# Patient Record
Sex: Male | Born: 2000
Health system: Southern US, Community
[De-identification: ages and names within clinical notes are randomized; demographics above are authoritative.]

## PROBLEM LIST (undated history)

## (undated) HISTORY — PX: TONSILLECTOMY: SUR1361

---
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
## 2019-08-25 ENCOUNTER — Other Ambulatory Visit: Payer: Self-pay

## 2019-08-25 ENCOUNTER — Encounter (HOSPITAL_BASED_OUTPATIENT_CLINIC_OR_DEPARTMENT_OTHER): Payer: Self-pay

## 2019-08-25 ENCOUNTER — Emergency Department (HOSPITAL_BASED_OUTPATIENT_CLINIC_OR_DEPARTMENT_OTHER)
Admission: EM | Admit: 2019-08-25 | Discharge: 2019-08-25 | Disposition: A | Discharge status: Home / Self Care | Payer: BC Managed Care – PPO | Attending: Emergency Medicine | Admitting: Emergency Medicine

## 2019-08-25 DIAGNOSIS — R350 Frequency of micturition: Secondary | ICD-10-CM | POA: Diagnosis not present

## 2019-08-25 DIAGNOSIS — Z720 Tobacco use: Secondary | ICD-10-CM | POA: Diagnosis not present

## 2019-08-25 DIAGNOSIS — Z79899 Other long term (current) drug therapy: Secondary | ICD-10-CM | POA: Insufficient documentation

## 2019-08-25 DIAGNOSIS — M545 Low back pain, unspecified: Secondary | ICD-10-CM

## 2019-08-25 DIAGNOSIS — R300 Dysuria: Secondary | ICD-10-CM | POA: Insufficient documentation

## 2019-08-25 LAB — COMPREHENSIVE METABOLIC PANEL
ALT: 15 U/L (ref 0–44)
AST: 18 U/L (ref 15–41)
Albumin: 4.6 g/dL (ref 3.5–5.0)
Alkaline Phosphatase: 45 U/L (ref 38–126)
Anion gap: 10 (ref 5–15)
BUN: 17 mg/dL (ref 6–20)
CO2: 24 mmol/L (ref 22–32)
Calcium: 9.5 mg/dL (ref 8.9–10.3)
Chloride: 105 mmol/L (ref 98–111)
Creatinine, Ser: 0.96 mg/dL (ref 0.61–1.24)
GFR calc Af Amer: >60 mL/min (ref >60)
GFR calc non Af Amer: >60 mL/min (ref >60)
Glucose, Bld: 101 mg/dL — ABNORMAL HIGH (ref 70–99)
Potassium: 3.6 mmol/L (ref 3.5–5.1)
Sodium: 139 mmol/L (ref 135–145)
Total Bilirubin: 0.7 mg/dL (ref 0.3–1.2)
Total Protein: 7.7 g/dL (ref 6.5–8.1)

## 2019-08-25 LAB — CBC WITH DIFFERENTIAL/PLATELET
Abs Immature Granulocytes: 0.01 10*3/uL (ref 0.00–0.07)
Basophils Absolute: 0 10*3/uL (ref 0.0–0.1)
Basophils Relative: 0 %
Eosinophils Absolute: 0.1 10*3/uL (ref 0.0–0.5)
Eosinophils Relative: 3 %
HCT: 47.7 % (ref 39.0–52.0)
Hemoglobin: 16.5 g/dL (ref 13.0–17.0)
Immature Granulocytes: 0 %
Lymphocytes Relative: 40 %
Lymphs Abs: 1.9 10*3/uL (ref 0.7–4.0)
MCH: 31.1 pg (ref 26.0–34.0)
MCHC: 34.6 g/dL (ref 30.0–36.0)
MCV: 90 fL (ref 80.0–100.0)
Monocytes Absolute: 0.6 10*3/uL (ref 0.1–1.0)
Monocytes Relative: 12 %
Neutro Abs: 2 10*3/uL (ref 1.7–7.7)
Neutrophils Relative %: 45 %
Platelets: 234 10*3/uL (ref 150–400)
RBC: 5.3 MIL/uL (ref 4.22–5.81)
RDW: 11.7 % (ref 11.5–15.5)
WBC: 4.6 10*3/uL (ref 4.0–10.5)
nRBC: 0 % (ref 0.0–0.2)

## 2019-08-25 LAB — URINALYSIS, ROUTINE W REFLEX MICROSCOPIC
Bilirubin Urine: NEGATIVE
Glucose, UA: NEGATIVE mg/dL
Hgb urine dipstick: NEGATIVE
Ketones, ur: NEGATIVE mg/dL
Leukocytes,Ua: NEGATIVE
Nitrite: NEGATIVE
Protein, ur: NEGATIVE mg/dL
Specific Gravity, Urine: 1.025 (ref 1.005–1.030)
pH: 6 (ref 5.0–8.0)

## 2019-08-25 LAB — HIV ANTIBODY (ROUTINE TESTING W REFLEX): HIV Screen 4th Generation wRfx: NONREACTIVE

## 2019-08-25 LAB — CK: Total CK: 249 U/L (ref 49–397)

## 2019-08-25 NOTE — ED Provider Notes (Signed)
Charles Carpenter   CSN: [id number] Arrival date & time: 08/25/19  0745     History Chief Complaint  Patient presents with  . Urinary Frequency    Charles Carpenter is a 19 y.o. male.  He is complaining of months worth of pain in his right lower back.  For the past 2 days he has had some burning stinging pain when he urinates.  He is sexually active.  Denies any discharge or lesions.  No fevers chills nausea vomiting diarrhea constipation.  He said about a year ago during football camp had similar kind of pain in his back and ultimately was diagnosed with rhabdo and had some element of renal failure.  He never got a clear answer why that happened.  He said he is here to get checked for his renal function and his potassium.  Also wants STD testing.  Admits to smoking but no hard drugs.  The history is provided by the patient.  Urinary Frequency This is a new problem. The current episode started 2 days ago. The problem occurs hourly. The problem has not changed since onset.Pertinent negatives include no chest pain, no abdominal pain, no headaches and no shortness of breath. Exacerbated by: urinating. Nothing relieves the symptoms. He has tried nothing for the symptoms. The treatment provided no relief.       History reviewed. No pertinent past medical history.  There are no problems to display for this patient.   Past Surgical History:  Procedure Laterality Date  . TONSILLECTOMY         History reviewed. No pertinent family history.  Social History   Tobacco Use  . Smoking status: Never Smoker  . Smokeless tobacco: Never Used  Substance Use Topics  . Alcohol use: Not Currently  . Drug use: Not Currently    Home Medications Prior to Admission medications   Medication Sig Start Date End Date Taking? Authorizing Provider  cetirizine (ZYRTEC) 5 MG chewable tablet Chew 5 mg by mouth daily. Patient uses this medication for allergies.     [provider]  ibuprofen (ADVIL,MOTRIN) 800 MG tablet Take 1 tablet (800 mg total) by mouth every 8 (eight) hours as needed for headache. 03/15/16   Molpus, John, MD  pantoprazole (PROTONIX) 20 MG tablet Take 1 tablet (20 mg total) by mouth daily. 12/07/16   Julianne Rice, MD  sucralfate (CARAFATE) 1 GM/10ML suspension Take 10 mLs (1 g total) by mouth 4 (four) times daily -  with meals and at bedtime. 12/07/16   Julianne Rice, MD    Allergies    Patient has no known allergies.  Review of Systems   Review of Systems  Constitutional: Negative for fever.  HENT: Negative for sore throat.   Eyes: Negative for visual disturbance.  Respiratory: Negative for shortness of breath.   Cardiovascular: Negative for chest pain.  Gastrointestinal: Negative for abdominal pain, nausea and vomiting.  Genitourinary: Positive for dysuria and frequency.  Musculoskeletal: Positive for back pain.  Skin: Negative for rash.  Neurological: Negative for headaches.    Physical Exam Updated Vital Signs BP 115/81 (BP Location: Right Arm)   Pulse 73   Temp 97.8 F (36.6 C) (Oral)   Resp 18   Ht 6\' 3"  (1.905 m)   Wt 82.9 kg   SpO2 100%   BMI 22.85 kg/m   Physical Exam Vitals and nursing Carpenter reviewed.  Constitutional:      Appearance: He is well-developed.  HENT:  Head: Normocephalic and atraumatic.  Eyes:     Conjunctiva/sclera: Conjunctivae normal.  Cardiovascular:     Rate and Rhythm: Normal rate and regular rhythm.     Heart sounds: No murmur.  Pulmonary:     Effort: Pulmonary effort is normal. No respiratory distress.     Breath sounds: Normal breath sounds.  Abdominal:     Palpations: Abdomen is soft.     Tenderness: There is no abdominal tenderness.  Musculoskeletal:        General: No deformity or signs of injury. Normal range of motion.     Cervical back: Neck supple.  Skin:    General: Skin is warm and dry.     Capillary Refill: Capillary refill takes less than 2  seconds.  Neurological:     General: No focal deficit present.     Mental Status: He is alert.     Sensory: No sensory deficit.     Motor: No weakness.     Gait: Gait normal.     ED Results / Procedures / Treatments   Labs (all labs ordered are listed, but only abnormal results are displayed) Labs Reviewed  COMPREHENSIVE METABOLIC PANEL - Abnormal; Notable for the following components:      Result Value   Glucose, Bld 101 (*)    All other components within normal limits  URINALYSIS, ROUTINE W REFLEX MICROSCOPIC  HIV ANTIBODY (ROUTINE TESTING W REFLEX)  CBC WITH DIFFERENTIAL/PLATELET  CK  RPR  GC/CHLAMYDIA PROBE AMP ([place]) NOT AT [facility]    EKG None  Radiology No results found.  Procedures Procedures (including critical care time)  Medications Ordered in ED Medications - No data to display  ED Course  I have reviewed the triage vital signs and the nursing notes.  Pertinent labs & imaging results that were available during my care of the patient were reviewed by me and considered in my medical decision making (see chart for details).  Clinical Course as of Aug 25 1711  Thu Aug 25, 2019  1528 19 year old male here with some right-sided back pain and some urinary frequency and dysuria.  STDs possibility along with urinary tract infection although less likely in males.  Also with a strange history of rhabdo and renal failure so we will check labs.  Patient agreeable to HIV and RPR also.   [MB]  [id number] Patient's work-up essentially unremarkable.  I reviewed all this with him and let him know that his STD testing is still pending and we will contact him if anything was positive.  He is comfortable with plan.   [MB]    Clinical Course User Index [MB] Terrilee Files, MD   MDM Rules/Calculators/A&P                       Final Clinical Impression(s) / ED Diagnoses Final diagnoses:  Urinary frequency  Acute right-sided low back pain without sciatica    Rx / DC  Orders ED Discharge Orders    None       Terrilee Files, MD 08/25/19 1714

## 2019-08-25 NOTE — ED Notes (Signed)
ED Provider at bedside.  Pt provided urine specimen container, states unable to provide at this time.

## 2019-08-25 NOTE — ED Triage Notes (Signed)
Pt states pain with urination for past 2 days, had dx Rhabdo 1 year ago.  States urine clear, yellow, does report right sided pain.

## 2019-08-25 NOTE — Discharge Instructions (Addendum)
You were seen in the emergency department for evaluation of some right-sided low back pain along with some urinary frequency.  You had blood work including potassium, CK and renal function along with a urinalysis that did not show any serious findings.  You also requested STD testing and this is pending at time of discharge.  We will call you with any further treatment is needed.  Please contact your primary care doctor for close follow-up.  Return to the emergency department if any worsening or concerning symptoms.

## 2019-08-26 LAB — RPR: RPR Ser Ql: NONREACTIVE

## 2019-11-04 ENCOUNTER — Encounter (HOSPITAL_BASED_OUTPATIENT_CLINIC_OR_DEPARTMENT_OTHER): Payer: Self-pay

## 2019-11-04 ENCOUNTER — Emergency Department (HOSPITAL_BASED_OUTPATIENT_CLINIC_OR_DEPARTMENT_OTHER)
Admission: EM | Admit: 2019-11-04 | Discharge: 2019-11-04 | Disposition: A | Discharge status: Home / Self Care | Payer: No Typology Code available for payment source | Attending: Emergency Medicine | Admitting: Emergency Medicine

## 2019-11-04 ENCOUNTER — Emergency Department (HOSPITAL_BASED_OUTPATIENT_CLINIC_OR_DEPARTMENT_OTHER): Payer: No Typology Code available for payment source

## 2019-11-04 ENCOUNTER — Other Ambulatory Visit: Payer: Self-pay

## 2019-11-04 DIAGNOSIS — S0990XA Unspecified injury of head, initial encounter: Secondary | ICD-10-CM | POA: Insufficient documentation

## 2019-11-04 DIAGNOSIS — Y92410 Unspecified street and highway as the place of occurrence of the external cause: Secondary | ICD-10-CM | POA: Diagnosis not present

## 2019-11-04 DIAGNOSIS — Y9389 Activity, other specified: Secondary | ICD-10-CM | POA: Insufficient documentation

## 2019-11-04 DIAGNOSIS — M25512 Pain in left shoulder: Secondary | ICD-10-CM | POA: Diagnosis not present

## 2019-11-04 DIAGNOSIS — M542 Cervicalgia: Secondary | ICD-10-CM

## 2019-11-04 DIAGNOSIS — Y999 Unspecified external cause status: Secondary | ICD-10-CM | POA: Insufficient documentation

## 2019-11-04 DIAGNOSIS — Z79899 Other long term (current) drug therapy: Secondary | ICD-10-CM | POA: Diagnosis not present

## 2019-11-04 MED ORDER — METHOCARBAMOL 500 MG PO TABS: 500.0000 mg | ORAL_TABLET | Freq: Two times a day (BID) | ORAL | 0 refills | Status: AC

## 2019-11-04 MED ORDER — NAPROXEN 500 MG PO TABS: 500.0000 mg | ORAL_TABLET | Freq: Two times a day (BID) | ORAL | 0 refills | Status: AC

## 2019-11-04 MED ORDER — ACETAMINOPHEN 325 MG PO TABS
650.0000 mg | ORAL_TABLET | Freq: Once | ORAL | Status: DC
  Filled 2019-11-04: qty 2

## 2019-11-04 MED FILL — NAPROXEN 500 MG TABS: 500 | 15 days supply | Qty: 30 | Fill #0

## 2019-11-04 MED FILL — METHOCARBAMOL 500 MG TABS: 500 | 10 days supply | Qty: 20 | Fill #0
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
## 2019-11-04 NOTE — ED Notes (Signed)
Pt declines any pain relievers at this time,  Awaiting radiology studies.
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
## 2020-01-05 ENCOUNTER — Encounter (HOSPITAL_COMMUNITY): Payer: Self-pay

## 2020-01-05 ENCOUNTER — Other Ambulatory Visit: Payer: Self-pay

## 2020-01-05 ENCOUNTER — Emergency Department (HOSPITAL_COMMUNITY)
Admission: EM | Admit: 2020-01-05 | Discharge: 2020-01-05 | Disposition: A | Discharge status: Home / Self Care | Payer: Self-pay | Attending: Emergency Medicine | Admitting: Emergency Medicine

## 2020-01-05 DIAGNOSIS — Z5321 Procedure and treatment not carried out due to patient leaving prior to being seen by health care provider: Secondary | ICD-10-CM | POA: Insufficient documentation

## 2020-01-05 DIAGNOSIS — H538 Other visual disturbances: Secondary | ICD-10-CM | POA: Insufficient documentation

## 2020-01-05 DIAGNOSIS — R4781 Slurred speech: Secondary | ICD-10-CM | POA: Insufficient documentation

## 2020-01-05 NOTE — ED Triage Notes (Signed)
Pt presents to ED via EMS with headache x 1 hour with blurred vision, photosensitivity, and stuttering speech yesterday. VSS, NAD, A&Ox4

## 2020-01-05 NOTE — ED Notes (Signed)
Called for Pt for Vitals, CT and Registration. No answer.

## 2020-01-05 NOTE — ED Notes (Signed)
Unable to locate patient for VS and registration. Called multiple times.

## 2020-01-21 ENCOUNTER — Emergency Department (HOSPITAL_BASED_OUTPATIENT_CLINIC_OR_DEPARTMENT_OTHER)
Admission: EM | Admit: 2020-01-21 | Discharge: 2020-01-21 | Disposition: A | Discharge status: Home / Self Care | Payer: Medicaid Other | Attending: Emergency Medicine | Admitting: Emergency Medicine

## 2020-01-21 ENCOUNTER — Other Ambulatory Visit: Payer: Self-pay

## 2020-01-21 ENCOUNTER — Encounter (HOSPITAL_BASED_OUTPATIENT_CLINIC_OR_DEPARTMENT_OTHER): Payer: Self-pay | Admitting: Emergency Medicine

## 2020-01-21 DIAGNOSIS — Z79899 Other long term (current) drug therapy: Secondary | ICD-10-CM | POA: Diagnosis not present

## 2020-01-21 DIAGNOSIS — F1210 Cannabis abuse, uncomplicated: Secondary | ICD-10-CM | POA: Insufficient documentation

## 2020-01-21 DIAGNOSIS — R197 Diarrhea, unspecified: Secondary | ICD-10-CM | POA: Diagnosis not present

## 2020-01-21 DIAGNOSIS — R112 Nausea with vomiting, unspecified: Secondary | ICD-10-CM | POA: Insufficient documentation

## 2020-01-21 NOTE — ED Provider Notes (Signed)
[facility] EMERGENCY DEPARTMENT Provider Note   CSN: [id number] Arrival date & time: 01/21/20  1100     History Chief Complaint  Patient presents with  . Emesis    Charles Carpenter is a 19 y.o. male without significant past medical history, presenting to the emergency department with complaint of nausea, vomiting, and diarrhea that began last night after dinner.  He states he ate salmon, mashed potatoes, green beans and garlic bread.  Soon after he began having nonbloody nonbilious emesis and diarrhea.  He states his vomiting stopped around 4 AM this morning.  He still having some watery diarrhea.  He has some crampy abdominal pain prior to having a bowel movement, however it improves with a bowel movement.  Not having any persistent abdominal pain, no fevers or chills.  He is tolerating p.o. liquids, and has been drinking quite a bit this morning.  No URI symptoms.  No known Covid exposures.  He states his employer requires a work note to return to work.  The history is provided by the patient.       History reviewed. No pertinent past medical history.  There are no problems to display for this patient.   Past Surgical History:  Procedure Laterality Date  . TONSILLECTOMY         History reviewed. No pertinent family history.  Social History   Tobacco Use  . Smoking status: Never Smoker  . Smokeless tobacco: Never Used  Vaping Use  . Vaping Use: Never used  Substance Use Topics  . Alcohol use: Not Currently  . Drug use: Yes    Types: Marijuana    Home Medications Prior to Admission medications   Medication Sig Start Date End Date Taking? Authorizing Provider  cetirizine (ZYRTEC) 5 MG chewable tablet Chew 5 mg by mouth daily. Patient uses this medication for allergies.    [provider]  ibuprofen (ADVIL,MOTRIN) 800 MG tablet Take 1 tablet (800 mg total) by mouth every 8 (eight) hours as needed for headache. 03/15/16   Molpus, John, MD  methocarbamol  (ROBAXIN) 500 MG tablet Take 1 tablet (500 mg total) by mouth 2 (two) times daily. 11/04/19   Mannie Stabile, PA-C  naproxen (NAPROSYN) 500 MG tablet Take 1 tablet (500 mg total) by mouth 2 (two) times daily. 11/04/19   Mannie Stabile, PA-C  pantoprazole (PROTONIX) 20 MG tablet Take 1 tablet (20 mg total) by mouth daily. 12/07/16   Loren Racer, MD  sucralfate (CARAFATE) 1 GM/10ML suspension Take 10 mLs (1 g total) by mouth 4 (four) times daily -  with meals and at bedtime. 12/07/16   Loren Racer, MD    Allergies    Patient has no known allergies.  Review of Systems   Review of Systems  Constitutional: Negative for chills and fever.  HENT: Negative for congestion and sore throat.   Respiratory: Negative for cough.   Gastrointestinal: Positive for diarrhea, nausea (Resolved) and vomiting (Resolved).  All other systems reviewed and are negative.   Physical Exam Updated Vital Signs BP 139/82 (BP Location: Right Arm)   Pulse 79   Temp 97.9 F (36.6 C)   Resp 18   Ht 6\' 2"  (1.88 m)   Wt 82.6 kg   SpO2 99%   BMI 23.37 kg/m   Physical Exam Vitals and nursing note reviewed.  Constitutional:      General: He is not in acute distress.    Appearance: He is well-developed.  Comments: Well-appearing  HENT:     Head: Normocephalic and atraumatic.     Mouth/Throat:     Mouth: Mucous membranes are moist.  Eyes:     Conjunctiva/sclera: Conjunctivae normal.  Cardiovascular:     Rate and Rhythm: Normal rate and regular rhythm.  Pulmonary:     Effort: Pulmonary effort is normal. No respiratory distress.     Breath sounds: Normal breath sounds.  Abdominal:     General: Bowel sounds are normal.     Palpations: Abdomen is soft.     Tenderness: There is no abdominal tenderness. There is no guarding or rebound.  Skin:    General: Skin is warm.  Neurological:     Mental Status: He is alert.  Psychiatric:        Behavior: Behavior normal.     ED Results /  Procedures / Treatments   Labs (all labs ordered are listed, but only abnormal results are displayed) Labs Reviewed - No data to display  EKG None  Radiology No results found.  Procedures Procedures (including critical care time)  Medications Ordered in ED Medications - No data to display  ED Course  I have reviewed the triage vital signs and the nursing notes.  Pertinent labs & imaging results that were available during my care of the patient were reviewed by me and considered in my medical decision making (see chart for details).    MDM Rules/Calculators/A&P                          Patient presenting to the ED with nausea, vomiting, diarrhea began after meal last night.  Vomiting has resolved, no severe abdominal pain associated.  No fevers or chills.  He is very well-appearing and in no distress.  Presentation is likely secondary to foodborne illness versus viral gastroenteritis.  No known Covid exposures or URI symptoms.  Given improving symptoms, benign abdominal exam, stable vital signs, do not believe further work-up is indicated.  Patient agrees with this plan.  Discussed symptomatic management and return precautions.  Work note provided. Final Clinical Impression(s) / ED Diagnoses Final diagnoses:  Nausea vomiting and diarrhea    Rx / DC Orders ED Discharge Orders    None       Shadow Stiggers, [place] N, PA-C 01/21/20 1130    Long, Arlyss Repress, MD 01/22/20 [phone]

## 2020-01-21 NOTE — ED Triage Notes (Signed)
Pt arrives pov with c/o abdominal pain, N/V/D after dinner last night. Pt denies fever

## 2020-01-21 NOTE — Discharge Instructions (Addendum)
Please read instructions below. °Drink clear liquids until your stomach feels better. Then, slowly introduce bland foods into your diet as tolerated, such as bread, rice, apples, bananas. °Follow up with your primary care if symptoms persist. °Return to the ER for severe abdominal pain, fever, uncontrollable vomiting, or new or concerning symptoms. ° °

## 2021-05-21 ENCOUNTER — Emergency Department (HOSPITAL_BASED_OUTPATIENT_CLINIC_OR_DEPARTMENT_OTHER)
Admission: EM | Admit: 2021-05-21 | Discharge: 2021-05-21 | Disposition: A | Discharge status: Home / Self Care | Payer: Medicaid Other | Attending: Emergency Medicine | Admitting: Emergency Medicine

## 2021-05-21 ENCOUNTER — Encounter (HOSPITAL_BASED_OUTPATIENT_CLINIC_OR_DEPARTMENT_OTHER): Payer: Self-pay | Admitting: *Deleted

## 2021-05-21 ENCOUNTER — Other Ambulatory Visit: Payer: Self-pay

## 2021-05-21 ENCOUNTER — Emergency Department (HOSPITAL_BASED_OUTPATIENT_CLINIC_OR_DEPARTMENT_OTHER): Payer: Medicaid Other

## 2021-05-21 DIAGNOSIS — S66102A Unspecified injury of flexor muscle, fascia and tendon of right middle finger at wrist and hand level, initial encounter: Secondary | ICD-10-CM | POA: Insufficient documentation

## 2021-05-21 DIAGNOSIS — S6991XA Unspecified injury of right wrist, hand and finger(s), initial encounter: Secondary | ICD-10-CM

## 2021-05-21 NOTE — ED Notes (Signed)
Patient discharged to home.  All discharge instructions reviewed.  Patient verbalized understanding via teachback method.  VS WDL.  Respirations even and unlabored.  Ambulatory out of ED.   °

## 2021-05-21 NOTE — Discharge Instructions (Addendum)
Below is the contact information for Dr. Yehuda Budd.  He is a Producer, television/film/video.  Please give them a call tomorrow and schedule an appointment for reevaluation.  Please return to the emergency department with any new or worsening symptoms.  It was a pleasure to meet you.
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
## 2023-03-01 IMAGING — DX DG HAND COMPLETE 3+V*R*
3 series · 3 of 3 positions shown · non-contrast
Comparison: None.

CLINICAL DATA: Injury to the third digit 3 months ago with
persistent pain inability to straighten at the proximal
interphalangeal joint.

EXAM:
RIGHT HAND - COMPLETE 3+ VIEW

[hand pa]
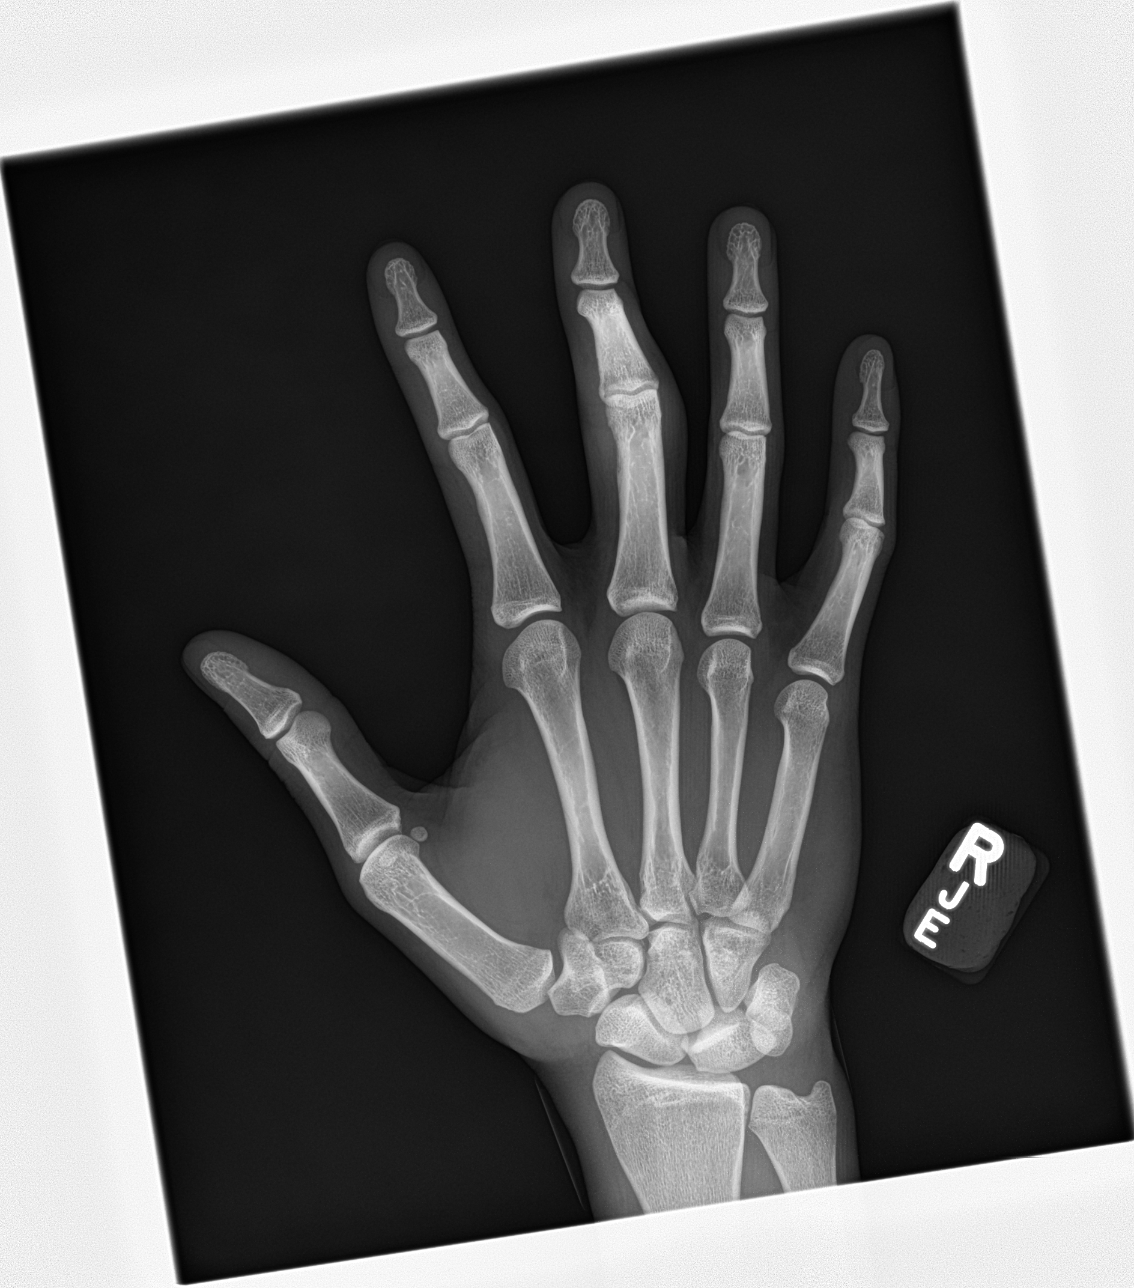

[hand obl]
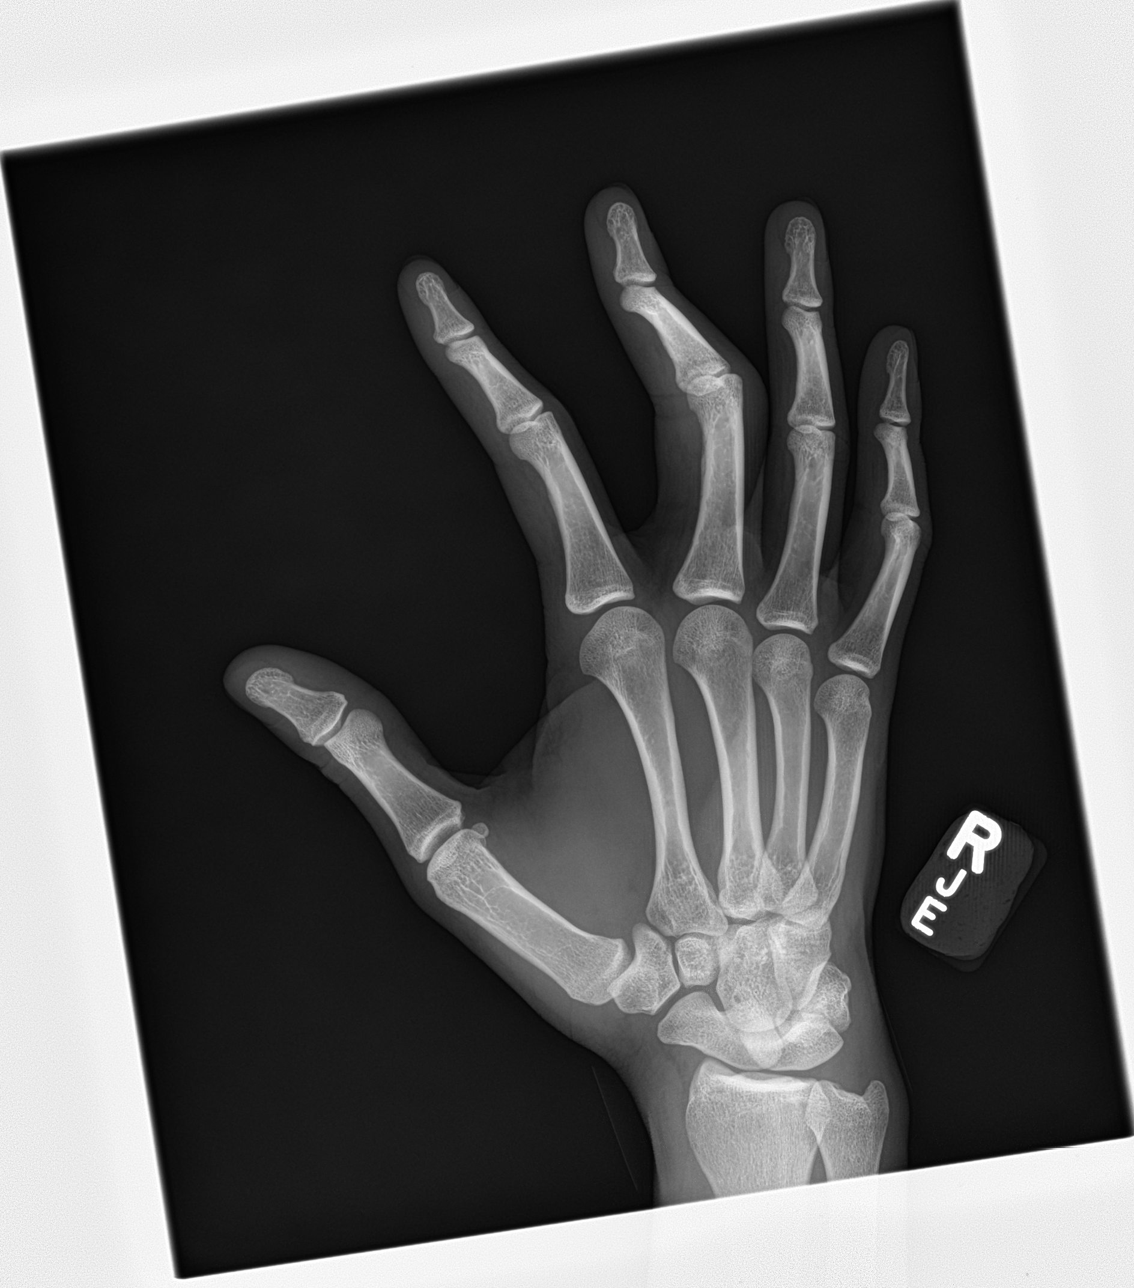

[hand lat]
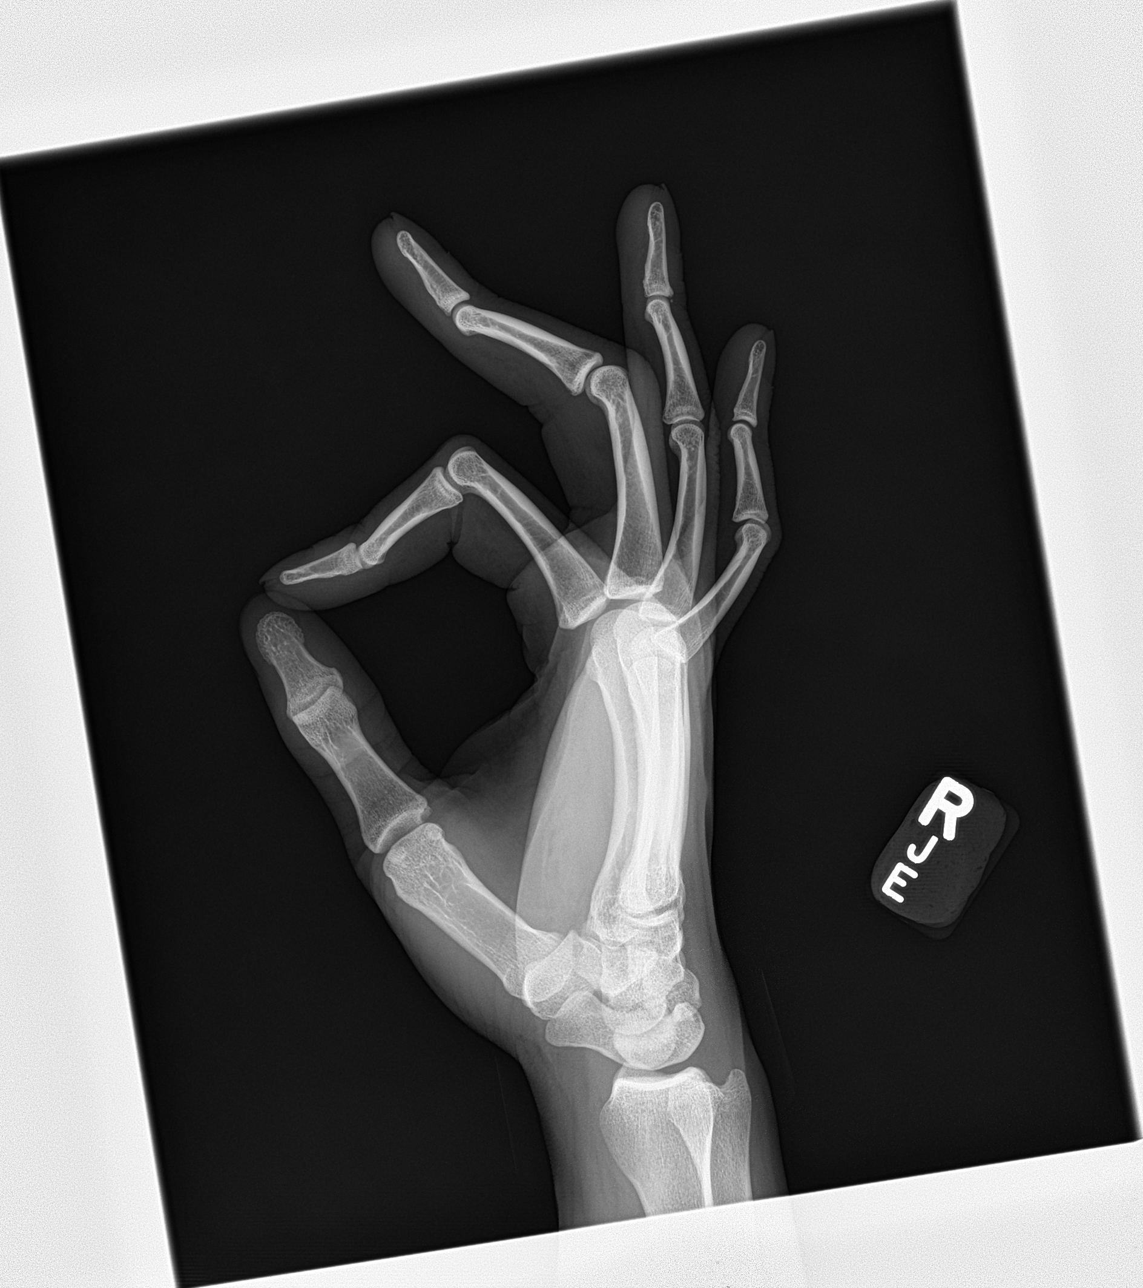

[3 of 3 positions shown; findings below may reference images not displayed]

FINDINGS: No acute fracture or dislocation is noted. Flexion at the third PIP
joint is noted. No gross soft tissue abnormality is seen.
IMPRESSION: No acute abnormality noted.
